# Patient Record
Sex: Male | Born: 1950 | Race: White | Hispanic: No | Marital: Married | Smoking: Never smoker
Health system: Southern US, Community
[De-identification: ages and names within clinical notes are randomized; demographics above are authoritative.]

## PROBLEM LIST (undated history)

## (undated) DIAGNOSIS — I10 Essential (primary) hypertension: Secondary | ICD-10-CM

---

## 2019-09-16 ENCOUNTER — Encounter (INDEPENDENT_AMBULATORY_CARE_PROVIDER_SITE_OTHER): Payer: Self-pay

## 2021-07-18 ENCOUNTER — Other Ambulatory Visit: Payer: Self-pay

## 2021-07-18 ENCOUNTER — Emergency Department (HOSPITAL_COMMUNITY): Payer: No Typology Code available for payment source

## 2021-07-18 ENCOUNTER — Encounter (HOSPITAL_COMMUNITY): Payer: Self-pay

## 2021-07-18 ENCOUNTER — Emergency Department (HOSPITAL_COMMUNITY)
Admission: EM | Admit: 2021-07-18 | Discharge: 2021-07-18 | Disposition: A | Discharge status: Home / Self Care | Payer: No Typology Code available for payment source | Attending: Emergency Medicine | Admitting: Emergency Medicine

## 2021-07-18 DIAGNOSIS — S0003XA Contusion of scalp, initial encounter: Secondary | ICD-10-CM | POA: Diagnosis not present

## 2021-07-18 DIAGNOSIS — W108XXA Fall (on) (from) other stairs and steps, initial encounter: Secondary | ICD-10-CM | POA: Insufficient documentation

## 2021-07-18 DIAGNOSIS — Z79899 Other long term (current) drug therapy: Secondary | ICD-10-CM | POA: Diagnosis not present

## 2021-07-18 DIAGNOSIS — R55 Syncope and collapse: Secondary | ICD-10-CM | POA: Diagnosis not present

## 2021-07-18 DIAGNOSIS — S060X1A Concussion with loss of consciousness of 30 minutes or less, initial encounter: Secondary | ICD-10-CM | POA: Diagnosis not present

## 2021-07-18 DIAGNOSIS — I10 Essential (primary) hypertension: Secondary | ICD-10-CM | POA: Diagnosis not present

## 2021-07-18 DIAGNOSIS — S62525A Nondisplaced fracture of distal phalanx of left thumb, initial encounter for closed fracture: Secondary | ICD-10-CM | POA: Diagnosis not present

## 2021-07-18 DIAGNOSIS — S0990XA Unspecified injury of head, initial encounter: Secondary | ICD-10-CM | POA: Diagnosis present

## 2021-07-18 HISTORY — DX: Essential (primary) hypertension: I10

## 2021-07-18 LAB — URINALYSIS, ROUTINE W REFLEX MICROSCOPIC
Bilirubin Urine: NEGATIVE
Glucose, UA: NEGATIVE mg/dL
Hgb urine dipstick: NEGATIVE
Ketones, ur: NEGATIVE mg/dL
Leukocytes,Ua: NEGATIVE
Nitrite: NEGATIVE
Protein, ur: NEGATIVE mg/dL
Specific Gravity, Urine: 1.014 (ref 1.005–1.030)
pH: 7 (ref 5.0–8.0)

## 2021-07-18 LAB — COMPREHENSIVE METABOLIC PANEL
ALT: 30 U/L (ref 0–44)
AST: 36 U/L (ref 15–41)
Albumin: 3.9 g/dL (ref 3.5–5.0)
Alkaline Phosphatase: 49 U/L (ref 38–126)
Anion gap: 7 (ref 5–15)
BUN: 18 mg/dL (ref 8–23)
CO2: 23 mmol/L (ref 22–32)
Calcium: 9.3 mg/dL (ref 8.9–10.3)
Chloride: 106 mmol/L (ref 98–111)
Creatinine, Ser: 1.28 mg/dL — ABNORMAL HIGH (ref 0.61–1.24)
GFR, Estimated: >60 mL/min (ref >60)
Glucose, Bld: 120 mg/dL — ABNORMAL HIGH (ref 70–99)
Potassium: 4.5 mmol/L (ref 3.5–5.1)
Sodium: 136 mmol/L (ref 135–145)
Total Bilirubin: 0.8 mg/dL (ref 0.3–1.2)
Total Protein: 7 g/dL (ref 6.5–8.1)

## 2021-07-18 LAB — CBC WITH DIFFERENTIAL/PLATELET
Abs Immature Granulocytes: 0.02 10*3/uL (ref 0.00–0.07)
Basophils Absolute: 0.1 10*3/uL (ref 0.0–0.1)
Basophils Relative: 1 %
Eosinophils Absolute: 0.2 10*3/uL (ref 0.0–0.5)
Eosinophils Relative: 4 %
HCT: 42.5 % (ref 39.0–52.0)
Hemoglobin: 14 g/dL (ref 13.0–17.0)
Immature Granulocytes: 0 %
Lymphocytes Relative: 25 %
Lymphs Abs: 1.5 10*3/uL (ref 0.7–4.0)
MCH: 29.5 pg (ref 26.0–34.0)
MCHC: 32.9 g/dL (ref 30.0–36.0)
MCV: 89.7 fL (ref 80.0–100.0)
Monocytes Absolute: 0.5 10*3/uL (ref 0.1–1.0)
Monocytes Relative: 9 %
Neutro Abs: 3.7 10*3/uL (ref 1.7–7.7)
Neutrophils Relative %: 61 %
Platelets: 201 10*3/uL (ref 150–400)
RBC: 4.74 MIL/uL (ref 4.22–5.81)
RDW: 12.8 % (ref 11.5–15.5)
WBC: 5.9 10*3/uL (ref 4.0–10.5)
nRBC: 0 % (ref 0.0–0.2)

## 2021-07-18 MED ORDER — SODIUM CHLORIDE 0.9 % IV BOLUS
1000.0000 mL | Freq: Once | INTRAVENOUS | Status: AC
  Administered 2021-07-18: 1000 mL via INTRAVENOUS

## 2021-07-18 MED ORDER — LEVETIRACETAM 500 MG PO TABS: 500.0000 mg | ORAL_TABLET | Freq: Two times a day (BID) | ORAL | 0 refills | Status: AC

## 2021-07-18 NOTE — ED Notes (Signed)
Pt able to ambulate to and from bathroom with a steady gait with no c/o dizziness.

## 2021-07-18 NOTE — Discharge Instructions (Addendum)
You have been prescribed Keppra to take twice a day for 7 days.

## 2021-07-18 NOTE — ED Notes (Signed)
Patient verbalizes understanding of discharge instructions. Prescriptions and follow-up care reviewed. Opportunity for questioning and answers were provided. Armband removed by staff, pt discharged from ED ambulatory.  

## 2021-07-18 NOTE — ED Triage Notes (Addendum)
Pt BIB [facility] from home c/o a syncopal episode. Pt was at the top of the stairs when he passed out. Pt's wife states he was out for about 2 mins. Pt fell down 4 steps and was placed in a c-collar for precautions. Pt cannot recall the event and has repetitive questions. Pt c/o neck discomfort.

## 2021-07-18 NOTE — Progress Notes (Signed)
Orthopedic Tech Progress Note Patient Details:  David Orozco Sep 28, 1951 [id number]  Ortho Devices Type of Ortho Device: Thumb spica splint Splint Material: Fiberglass Ortho Device/Splint Location: LUE Ortho Device/Splint Interventions: Ordered, Application   Post Interventions Patient Tolerated: Well Instructions Provided: Care of device, Poper ambulation with device  Nikolas [name] [name] 07/18/2021, 12:45 PM

## 2021-07-18 NOTE — Consult Note (Signed)
CC: s/p fall  HPI:     Patient is a 70 y.o. male with HTN who suffered LOC and fell down 4 or five steps, hitting his head.  Prior to the fall, he felt lightheaded.  No seizure activity.  He is not on any blood thinners.  He was taken to the hospital where a CT head was obtained and showed two small right frontal shear injuries, likely post-traumatic.  Currently, he c/o left hand pain but no headache, cognitive issues, n/v.    There are no problems to display for this patient.  Past Medical History:  Diagnosis Date   Hypertension     History reviewed. No pertinent surgical history.  (Not in a hospital admission)  No Known Allergies  Social History   Tobacco Use   Smoking status: Never   Smokeless tobacco: Not on file  Substance Use Topics   Alcohol use: Not on file    History reviewed. No pertinent family history.   Review of Systems Pertinent items noted in HPI and remainder of comprehensive ROS otherwise negative.  Objective:   Patient Vitals for the past 8 hrs:  BP Temp Temp src Pulse Resp SpO2 Height Weight  07/18/21 1300 122/73 -- -- 69 19 99 % -- --  07/18/21 1245 132/78 -- -- 69 17 99 % -- --  07/18/21 1200 (!) 159/89 -- -- 70 20 100 % -- --  07/18/21 1145 139/90 -- -- 67 18 99 % -- --  07/18/21 1135 (!) 146/77 -- -- 70 20 98 % -- --  07/18/21 1030 (!) 137/94 -- -- 68 (!) 23 97 % -- --  07/18/21 1015 (!) 147/91 -- -- 66 19 98 % -- --  07/18/21 1000 (!) 152/93 -- -- 67 (!) 21 97 % -- --  07/18/21 0945 (!) 147/86 -- -- 66 18 96 % -- --  07/18/21 0941 (!) 147/86 97.9 F (36.6 C) Oral 65 18 97 % -- --  07/18/21 0939 -- -- -- -- -- -- 5\' 7"  (1.702 m) 89.8 kg   No intake/output data recorded. Total I/O In: 1000 [IV Piggyback:1000] Out: -     General : Alert, cooperative, no distress, appears stated age   Head:  Normocephalic/atraumatic    Eyes: Prosthetic left ey.  Fundi could not be visualized Neck: Supple Chest:  Respirations unlabored Chest wall: no  tenderness or deformity Heart: Regular rate and rhythm Abdomen: Soft, nontender and nondistended Extremities: warm and well-perfused. Left forearm and hand in splint Skin: normal turgor, color and texture Neurologic:  Alert, oriented x 3.  Eyes open spontaneously. Prosthetic left eye, VFC, no facial droop. V1-3 intact.  No dysarthria, tongue protrusion symmetric.  CNII-XII intact. Normal strength, sensation and reflexes throughout.  No pronator drift, full strength in legs       Data ReviewCBC:  Lab Results  Component Value Date   WBC 5.9 07/18/2021   RBC 4.74 07/18/2021   BMP:  Lab Results  Component Value Date   GLUCOSE 120 (H) 07/18/2021   CO2 23 07/18/2021   BUN 18 07/18/2021   CREATININE 1.28 (H) 07/18/2021   CALCIUM 9.3 07/18/2021   Radiology review:  CT head without contrast personally reviewed  Assessment:   70 yo M s/p fall with small traumatic shear injuries of right frontal lobe  Plan:   - no neurosurgical intervention indicated - recommend Keppra x 7 days - recommend repeat CT head without contrast in 4-6 hrs; if imaging stable and no new  neurologic issues, patient can be discharged with no need for neurosurgical f/u

## 2021-07-18 NOTE — ED Provider Notes (Signed)
[facility] EMERGENCY DEPARTMENT Provider Note   CSN: [id number] Arrival date & time: 07/18/21  0932     History Chief Complaint  Patient presents with   Loss of Consciousness    David Orozco is a 70 y.o. male. PMH of HTN.  Patient presents after a syncopal episode. he was at the top of the stairs when he started to feel lightheaded, he then passed out and fell down the stairs.  His wife was there for the incident.  She stated that he passed out for about 2 minutes. He does not remember the fall and still continues to have amnesia. He now complains of neck pain and left thumb pain. He denies headache, chest pain, shortness of breath, abdominal pain, numbness or tingling to extremities, weakness. Of note, patient states that he recently changed the dose on one of his blood pressure medications, but he does not remember the medication or the dose. He is normally seen by the [place] so we do not have access to these records.   Information obtained by wife.  She states that he was walking up the stairs when he started getting lightheaded, once he was at the top of the stairs he passed out and fell down 4 steps.  She states that he woke up about 15 minutes later.  He was recently seen by his [place] doctor where he change one of his blood pressure medications to losartan due to him having decreased kidney function.  She denies him having any recent chest pain, shortness of breath, or other syncopal episodes similar to this.   Loss of Consciousness Associated symptoms: dizziness   Associated symptoms: no chest pain, no fever, no headaches, no nausea, no palpitations, no shortness of breath, no vomiting and no weakness       Past Medical History:  Diagnosis Date   Hypertension     There are no problems to display for this patient.   History reviewed. No pertinent surgical history.     History reviewed. No pertinent family history.  Social History   Tobacco Use   Smoking  status: Never    Home Medications Prior to Admission medications   Medication Sig Start Date End Date Taking? Authorizing Provider  acetaminophen (TYLENOL) 500 MG tablet Take 500 mg by mouth every 6 (six) hours as needed for mild pain or headache.   Yes [provider]  carboxymethylcellulose (REFRESH PLUS) 0.5 % SOLN Place 1 drop into both eyes daily as needed for dry eyes. 07/05/21  Yes [provider]  cetirizine (ZYRTEC) 10 MG tablet Take 10 mg by mouth daily. 04/23/18  Yes [provider]  Cholecalciferol 25 MCG (1000 UT) tablet Take 1,000 Units by mouth at bedtime.   Yes [provider]  fluticasone (FLONASE) 50 MCG/ACT nasal spray Place 2 sprays into both nostrils daily as needed for allergies or rhinitis. 08/30/19  Yes [provider]  levETIRAcetam (KEPPRA) 500 MG tablet Take 1 tablet (500 mg total) by mouth 2 (two) times daily for 7 days. 07/18/21 07/25/21 Yes Torrin Frein, Finis Bud, PA-C  losartan (COZAAR) 25 MG tablet Take 12.5 mg by mouth daily. 06/20/21  Yes [provider]  Magnesium 400 MG TABS Take 400 mg by mouth at bedtime.   Yes [provider]  montelukast (SINGULAIR) 10 MG tablet Take 10 mg by mouth at bedtime. 07/03/21  Yes [provider]  Multiple Vitamin (MULTIVITAMIN) tablet Take 1 tablet by mouth daily.   Yes [provider]  sildenafil (VIAGRA) 50 MG tablet Take 25 mg by mouth daily as needed for erectile dysfunction. 11/07/20  Yes [provider]  tamsulosin (FLOMAX) 0.4 MG CAPS capsule Take 0.4 mg by mouth daily as needed (urination). 05/06/21  Yes [provider]    Allergies    Patient has no known allergies.  Review of Systems   Review of Systems  Constitutional:  Negative for fatigue and fever.  HENT:  Negative for facial swelling, sinus pressure and sinus pain.   Eyes:  Negative for pain, redness and visual disturbance.  Respiratory:  Negative for cough, chest tightness  and shortness of breath.   Cardiovascular:  Positive for syncope. Negative for chest pain, palpitations and leg swelling.  Gastrointestinal:  Negative for abdominal distention, abdominal pain, constipation, diarrhea, nausea and vomiting.  Genitourinary:  Negative for decreased urine volume, difficulty urinating, dysuria, hematuria and urgency.  Musculoskeletal:  Positive for neck pain. Negative for arthralgias, back pain and gait problem.  Neurological:  Positive for dizziness, syncope and light-headedness. Negative for weakness, numbness and headaches.  All other systems reviewed and are negative.  Physical Exam Updated Vital Signs BP (!) 150/87   Pulse 67   Temp 97.9 F (36.6 C)   Resp (!) 24   Ht 5\' 7"  (1.702 m)   Wt 89.8 kg   SpO2 100%   BMI 31.01 kg/m   Physical Exam Vitals and nursing note reviewed.  Constitutional:      General: He is not in acute distress.    Appearance: Normal appearance. He is not ill-appearing, toxic-appearing or diaphoretic.  HENT:     Head: Normocephalic.     Comments: Bruising noted on top of head.  Dried blood outside of right ear.    Nose: Nose normal.  Eyes:     General: No scleral icterus.       Right eye: No discharge.        Left eye: No discharge.     Extraocular Movements: Extraocular movements intact.     Conjunctiva/sclera: Conjunctivae normal.     Pupils: Pupils are equal, round, and reactive to light.     Comments: Left prosthetic Eye. Pupil does not react. EOM impaired on that side. This is baseline.  Cardiovascular:     Rate and Rhythm: Normal rate and regular rhythm.     Pulses: Normal pulses.     Heart sounds: Normal heart sounds, S1 normal and S2 normal. No murmur heard.   No friction rub. No gallop.  Pulmonary:     Effort: Pulmonary effort is normal. No respiratory distress.     Breath sounds: Normal breath sounds. No wheezing, rhonchi or rales.  Chest:     Chest wall: No tenderness.  Abdominal:     General: Abdomen is  flat. Bowel sounds are normal. There is no distension.     Palpations: Abdomen is soft. There is no pulsatile mass.     Tenderness: There is no abdominal tenderness. There is no guarding or rebound.  Musculoskeletal:     Cervical back: Normal range of motion. Tenderness present.     Right lower leg: No edema.     Left lower leg: No edema.  Skin:    General: Skin is warm and dry.     Coloration: Skin is not jaundiced.     Findings: No bruising, erythema, lesion or rash.  Neurological:     General: No focal deficit present.     Mental Status: He is alert.  He is confused.     Cranial Nerves: Cranial nerves are intact.     Sensory: Sensation is intact.     Motor: Motor function is intact.     Comments: Amnesia noted. No focal deficits.   Psychiatric:        Mood and Affect: Mood normal.        Behavior: Behavior normal.    ED Results / Procedures / Treatments   Labs (all labs ordered are listed, but only abnormal results are displayed) Labs Reviewed  COMPREHENSIVE METABOLIC PANEL - Abnormal; Notable for the following components:      Result Value   Glucose, Bld 120 (*)    Creatinine, Ser 1.28 (*)    All other components within normal limits  URINALYSIS, ROUTINE W REFLEX MICROSCOPIC - Abnormal; Notable for the following components:   APPearance HAZY (*)    All other components within normal limits  CBC WITH DIFFERENTIAL/PLATELET    EKG EKG Interpretation  Date/Time:  Thursday July 18 2021 09:47:07 EDT Ventricular Rate:  66 PR Interval:  202 QRS Duration: 98 QT Interval:  400 QTC Calculation: 420 R Axis:   -17 Text Interpretation: Sinus rhythm Borderline left axis deviation Abnormal R-wave progression, early transition No old tracing to compare Confirmed by Pricilla LovelessGoldston, Scott [phone]([id number]) on 07/18/2021 9:53:50 AM  Radiology DG Chest 2 View  Result Date: 07/18/2021 CLINICAL DATA:  Fall EXAM: CHEST - 2 VIEW COMPARISON:  None. FINDINGS: Low lung volumes. Minimal atelectasis at  the left lung base. Cardiomediastinal contours are within normal limits. Calcified plaque along the aortic arch. No pleural effusion or pneumothorax. Osseous structures are unremarkable. IMPRESSION: Minimal atelectasis left lung base. Electronically Signed   By: Guadlupe SpanishPraneil  Patel M.D.   On: 07/18/2021 11:41   CT HEAD WO CONTRAST (5MM)  Result Date: 07/18/2021 CLINICAL DATA:  Headache, intracranial hemorrhage suspected EXAM: CT HEAD WITHOUT CONTRAST TECHNIQUE: Contiguous axial images were obtained from the base of the skull through the vertex without intravenous contrast. COMPARISON:  Head CT 5 hours ago. FINDINGS: Brain: Stable appearance of 2 small subcentimeter shear hemorrhages in the right frontal lobe, series 3, images 26 and 23. No increased blood volume. There is trace edema adjacent to the more anterior hemorrhage but no significant mass effect. No midline shift. No new hemorrhage. No subdural or extra-axial collection. No hydrocephalus. Basilar cisterns are patent. No evidence of acute ischemia. Vascular: Atherosclerosis of skullbase vasculature without hyperdense vessel or abnormal calcification. Skull: No fracture or focal lesion. Sinuses/Orbits: Again seen mucosal thickening throughout the paranasal sinuses. Similar fluid level or retention cyst in the left maxillary sinus. No change from prior exam. The mastoid air cells are clear. Left globe prosthesis. Other: None. IMPRESSION: 1. Stable appearance of 2 small subcentimeter shear hemorrhages in the right frontal lobe. There is trace edema adjacent to the more anterior hemorrhage but no significant mass effect or midline shift. 2. No new abnormality. Electronically Signed   By: Narda RutherfordMelanie  Sanford M.D.   On: 07/18/2021 16:44   CT HEAD WO CONTRAST (5MM)  Addendum Date: 07/18/2021   ADDENDUM REPORT: 07/18/2021 12:01 ADDENDUM: Study discussed by telephone with Pricilla LovelessScott Goldston, MD on 07/18/2021 at 1157 hours. Electronically Signed   By: Odessa FlemingH  Hall M.D.   On:  07/18/2021 12:01   Result Date: 07/18/2021 CLINICAL DATA:  70 year old male status post fall backwards down stairs. Loss of consciousness. Blood in the left ear. EXAM: CT HEAD WITHOUT CONTRAST TECHNIQUE: Contiguous axial images were obtained from the base of  the skull through the vertex without intravenous contrast. COMPARISON:  None. FINDINGS: Brain: There are 2 small white matter shear hemorrhages identified in the right frontal lobe, sagittal images 28 and 29. These are at or near the gray-white matter junction. No associated edema or mass effect. No other acute intracranial hemorrhage identified. No midline shift, mass effect, or evidence of intracranial mass lesion. No ventriculomegaly. Underlying gray-white matter differentiation is within normal limits for age. No cortically based acute infarct identified. Vascular: Calcified atherosclerosis at the skull base. No suspicious intracranial vascular hyperdensity. Skull: No fracture identified. Sinuses/Orbits: Bilateral tympanic cavities and mastoids appear clear. Both external auditory canals also appear normal. There is scattered paranasal sinus mucosal thickening, mostly in the right frontoethmoidal recess and left sphenoid sinus. No sinus fluid levels. Other: Left orbit prosthesis. No acute orbit or scalp soft tissue injury identified. IMPRESSION: 1. There are two small shear hemorrhages in the right frontal lobe. No associated edema or mass effect. 2. No other acute traumatic injury to the brain identified. 3. No skull fracture is identified, and the middle ears and mastoids are clear. 4. Mild to moderate paranasal sinus inflammation. Electronically Signed: By: Odessa Fleming M.D. On: 07/18/2021 11:56   CT Cervical Spine Wo Contrast  Result Date: 07/18/2021 CLINICAL DATA:  70 year old male status post fall backwards down stairs. Loss of consciousness. Blood in the left ear. EXAM: CT CERVICAL SPINE WITHOUT CONTRAST TECHNIQUE: Multidetector CT imaging of the  cervical spine was performed without intravenous contrast. Multiplanar CT image reconstructions were also generated. COMPARISON:  Head CT today. FINDINGS: Alignment: Straightening of cervical lordosis. Cervicothoracic junction alignment is within normal limits. Bilateral posterior element alignment is within normal limits. Skull base and vertebrae: Visualized skull base is intact. No atlanto-occipital dissociation. C1 and C2 appear intact and aligned. No acute osseous abnormality identified. Soft tissues and spinal canal: No prevertebral fluid or swelling. No visible canal hematoma. Calcified carotid atherosclerosis, but otherwise negative visible neck soft tissues. Disc levels: Widespread chronic disc and endplate degeneration. But mild if any associated cervical spinal stenosis. Upper chest: Visible upper thoracic levels appear intact. Negative lung apices. Calcified aortic atherosclerosis. IMPRESSION: 1. No acute traumatic injury identified in the cervical spine. 2. 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 3. Aortic Atherosclerosis (ICD10-I70.0). Electronically Signed   By: Odessa Fleming M.D.   On: 07/18/2021 12:00   DG Finger Thumb Left  Result Date: 07/18/2021 CLINICAL DATA:  Fall, proximal left thumb tenderness EXAM: LEFT THUMB 2+V COMPARISON:  None. FINDINGS: There is linear lucency at the distal thumb metacarpal neck only seen on a single view. There is no significant degenerative change. IMPRESSION: Findings suspicious for nondisplaced fracture of the distal thumb metacarpal. Correlate with point tenderness. Electronically Signed   By: Caprice Renshaw M.D.   On: 07/18/2021 11:29    Procedures Procedures   Medications Ordered in ED Medications  sodium chloride 0.9 % bolus 1,000 mL (0 mLs Intravenous Stopped 07/18/21 1358)    ED Course  I have reviewed the triage vital signs and the nursing notes.  Pertinent labs & imaging results that were available during my care of the patient were  reviewed by me and considered in my medical decision making (see chart for details).  Clinical Course as of 07/18/21 1815  Thu Jul 18, 2021  1135 Left thumb XR notable for possible fracture. Thumb Spica Ordered.  [GL]    Clinical Course User Index [GL] Grettell Ransdell, Finis Bud, PA-C   MDM Rules/Calculators/A&P  Patient presents to the ED with complaints of syncope while walking up stairs resulting in a fall down stairs. Nontoxic, vitals stable. Orthostatics positive.  No evidence of numbness tingling or weakness in his extremities.  Exam notable for bruising on top of head and cervical neck pain.  Amnesia present.  Patient has some repetitive questioning and expressive aphasia present. No focal deficits present on exam.  No cranial nerve deficits on exam.  Patient is not on blood thinners. Patient is complaining of left thumb pain.  Will obtain x-ray.  CT head and cervical spine ordered for amnesia, age, and cervical tenderness.  Basic labs ordered.    CBC unremarkable.  CMP with elevated creatinine. Reviewed past records that wife has. This is a stable value comparison to a visit in July.  EKG unremarkable.   X-ray of left thumb shows possible nondisplaced fracture.  He will be placed in a thumb spica.  Chest x-ray was unremarkable.   CT head showed 2 small shear hemorrhages in the right frontal lobe with no associated edema or mass-effect.  No skull fracture.  No other acute TBI to brain identified incidental finding of mild to moderate paranasal sinus inflammation.   CT cervical spine with no acute cervical injury.  C-spine collar removed.  Treatment in the ED included 1 L IVF bolus for orthostatic hypotension. Neurosurgery was consulted for CT findings of shear hemorrhages. Recommended repeat head CT 4 hours after initial CT scan. Recommended Keppra 500 mg for 7 days.  Plan to obtain repeat CT head at 4 PM.  Overall, work-up for syncope was largely unremarkable with low  suspicion for cardiac etiology. Discussed the option of admitting patient for observation, however patient declines. He is willing to stay and get his repeat CT scan at 1600.  Repeat CT obtained and stable from before.  Patient will be discharged home on 1 week of Keppra.  He is to follow-up with his PCP in 1 week.  He was given return precautions if he develops worsening symptoms.  Portions of this note were generated with Scientist, clinical (histocompatibility and immunogenetics). Dictation errors may occur despite best attempts at proofreading.   Final Clinical Impression(s) / ED Diagnoses Final diagnoses:  Concussion with loss of consciousness of 30 minutes or less, initial encounter  Fall (on) (from) other stairs and steps, initial encounter  Syncope, unspecified syncope type  Nondisplaced fracture of distal phalanx of left thumb, initial encounter for closed fracture    Rx / DC Orders ED Discharge Orders          Ordered    levETIRAcetam (KEPPRA) 500 MG tablet  2 times daily        07/18/21 1651    Ambulatory referral to Physical Medicine Rehab       Comments: TBI   Pending             Claudie Leach, PA-C 07/18/21 1815    Pricilla Loveless, MD 07/19/21 2302

## 2022-02-18 IMAGING — CT CT CERVICAL SPINE W/O CM
3 of 4 series · 13 of 35 positions shown, 16 images · non-contrast
Comparison: Head CT today.

CLINICAL DATA: 69-year-old male status post fall backwards down
stairs. Loss of consciousness. Blood in the left ear.

EXAM:
CT CERVICAL SPINE WITHOUT CONTRAST
TECHNIQUE: Multidetector CT imaging of the cervical spine was performed without
intravenous contrast. Multiplanar CT image reconstructions were also
generated.

[Series 4: c_spine 2.0 st · axial · 0.25mm/px · z∈[-265,-141]mm · 5 of 94 slices shown, 7 images]
[im 16/94  soft-tissue]
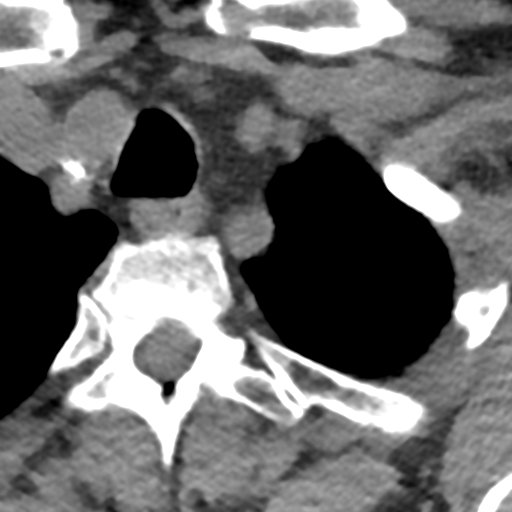
[im 16/94  bone]
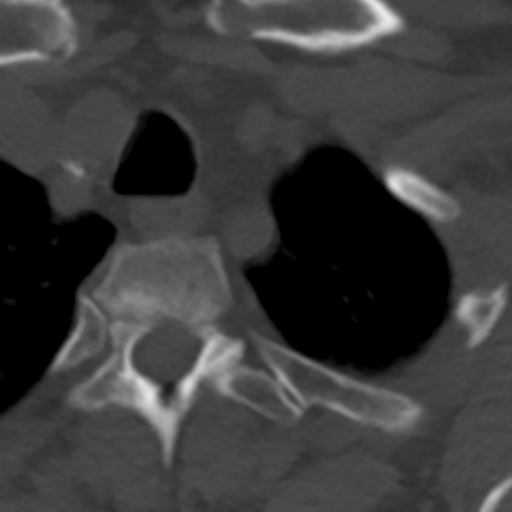
[im 32/94  bone]
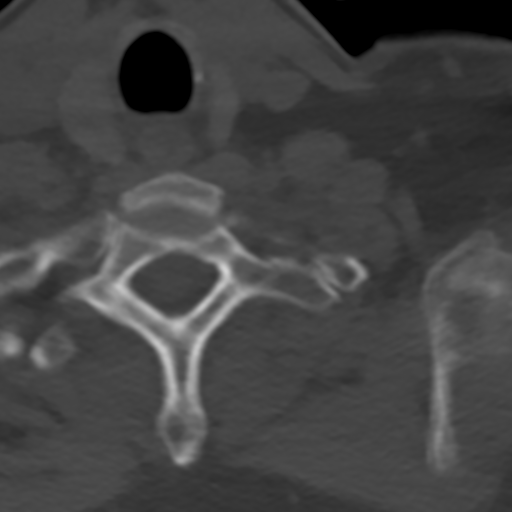
[im 47/94  bone]
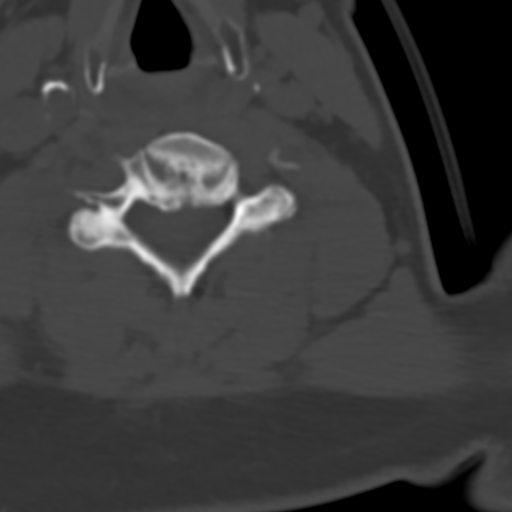
[im 63/94  bone]
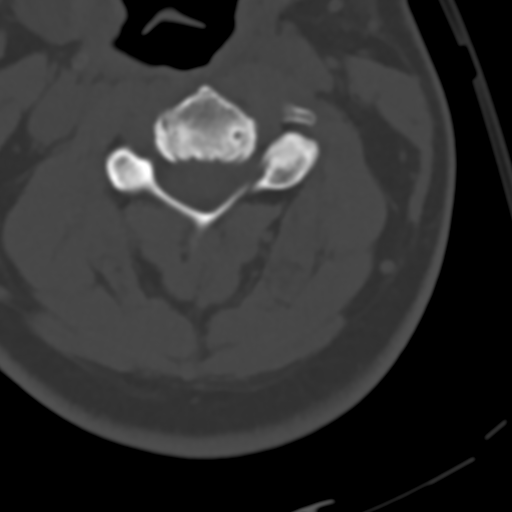
[im 78/94  soft-tissue]
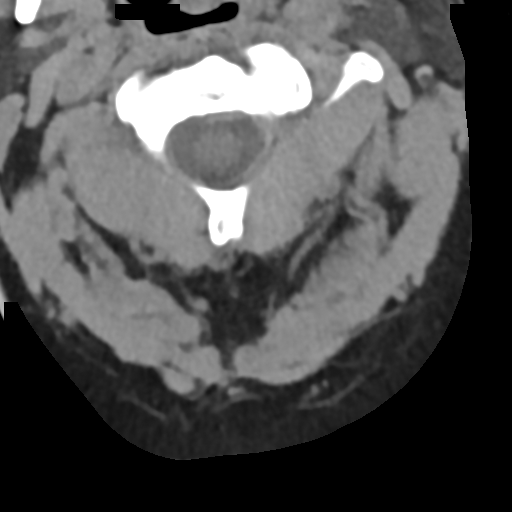
[im 78/94  bone]
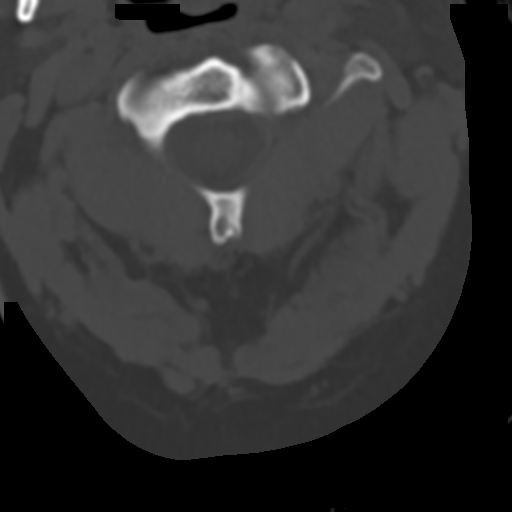

[Series 7: coronal bone · coronal · 0.23mm/px · 3 of 81 slices shown]
[im 17/81  bone]
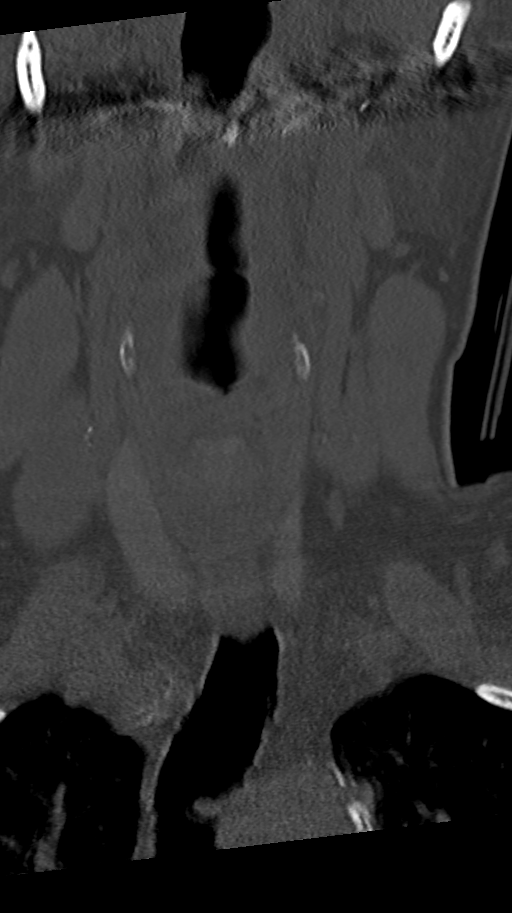
[im 33/81  bone]
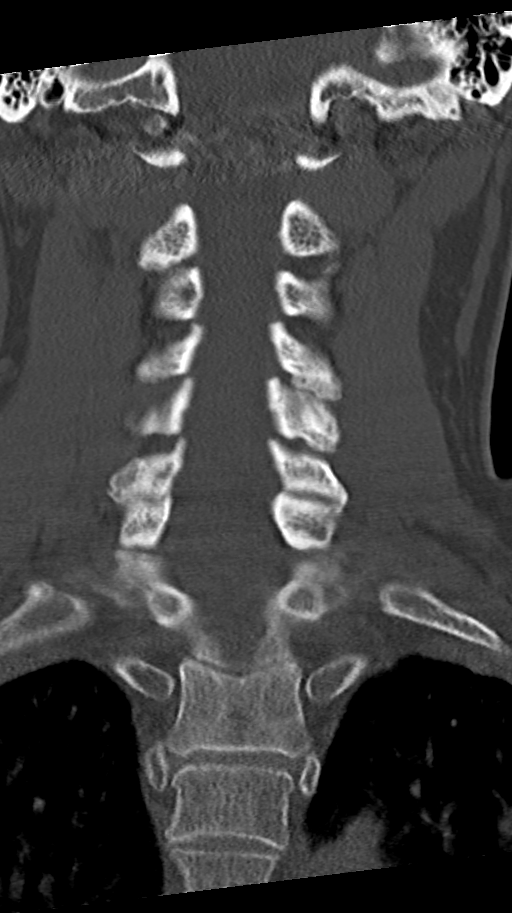
[im 49/81  bone]
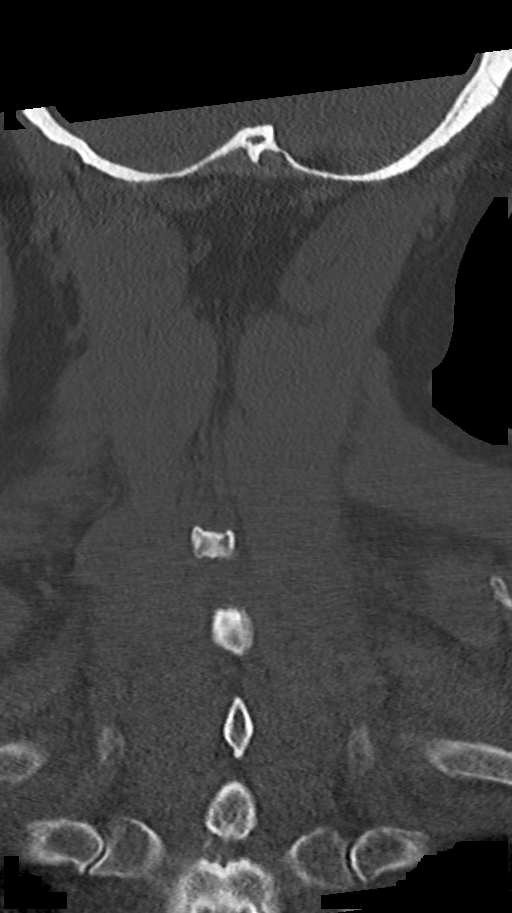

[Series 8: sagittal bone · sagittal · 0.38mm/px · 5 of 61 slices shown, 6 images]
[im 21/61  bone]
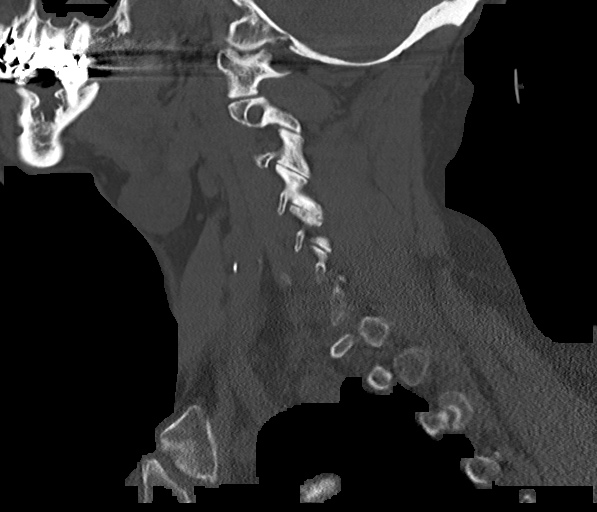
[im 26/61  bone]
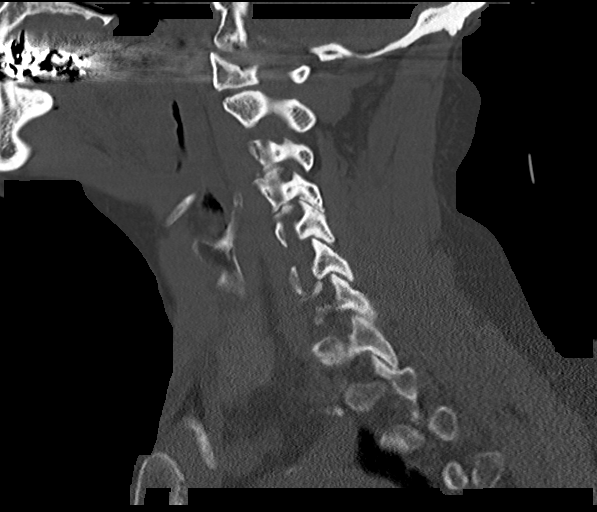
[im 31/61  soft-tissue]
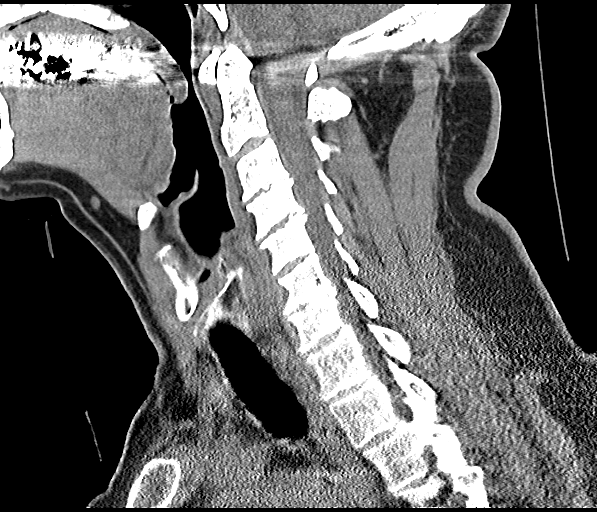
[im 31/61  bone]
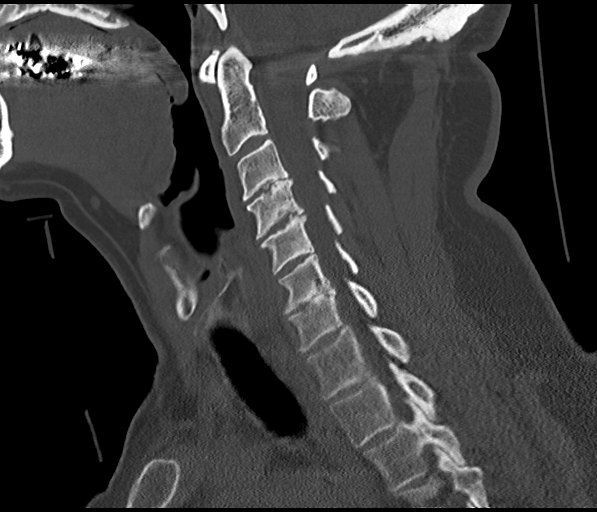
[im 36/61  bone]
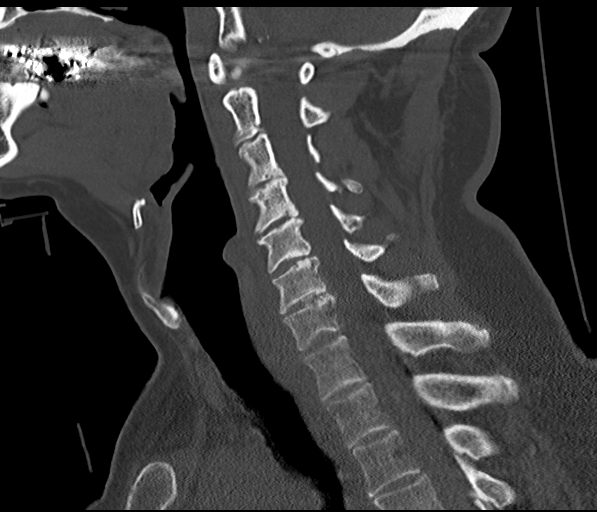
[im 41/61  bone]
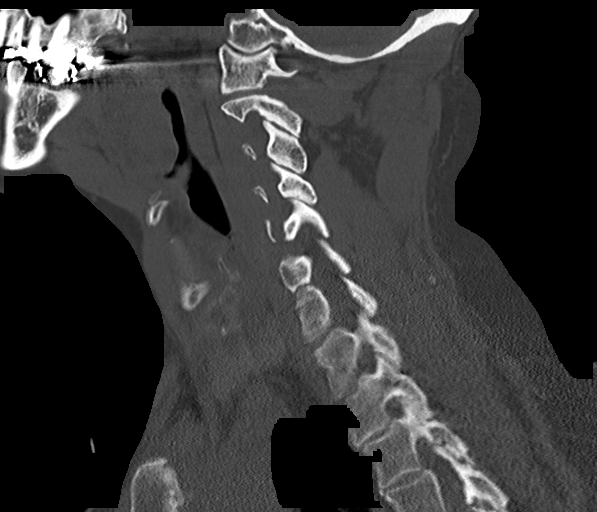

[13 of 35 positions shown; findings below may reference images not displayed]

FINDINGS: Alignment: Straightening of cervical lordosis. Cervicothoracic
junction alignment is within normal limits. Bilateral posterior
element alignment is within normal limits.

Skull base and vertebrae: Visualized skull base is intact. No
atlanto-occipital dissociation. C1 and C2 appear intact and aligned.
No acute osseous abnormality identified.

Soft tissues and spinal canal: No prevertebral fluid or swelling. No
visible canal hematoma. Calcified carotid atherosclerosis, but
otherwise negative visible neck soft tissues.

Disc levels: Widespread chronic disc and endplate degeneration. But
mild if any associated cervical spinal stenosis.

Upper chest: Visible upper thoracic levels appear intact. Negative
lung apices. Calcified aortic atherosclerosis.
IMPRESSION: 1. No acute traumatic injury identified in the cervical spine.
2. Widespread chronic cervical disc and endplate degeneration.
3. Aortic Atherosclerosis ([id number]).
